# Patient Record
Sex: Female | Born: 1995 | Race: White | Hispanic: No | Marital: Single | Smoking: Never smoker
Health system: Southern US, Community
[De-identification: ages and names within clinical notes are randomized; demographics above are authoritative.]

## PROBLEM LIST (undated history)

## (undated) DIAGNOSIS — F319 Bipolar disorder, unspecified: Secondary | ICD-10-CM

## (undated) HISTORY — PX: TONSILLECTOMY: SUR1361

---

## 2017-03-24 ENCOUNTER — Encounter (HOSPITAL_BASED_OUTPATIENT_CLINIC_OR_DEPARTMENT_OTHER): Payer: Self-pay | Admitting: Emergency Medicine

## 2017-03-24 ENCOUNTER — Emergency Department (HOSPITAL_BASED_OUTPATIENT_CLINIC_OR_DEPARTMENT_OTHER)
Admission: EM | Admit: 2017-03-24 | Discharge: 2017-03-24 | Disposition: A | Discharge status: Home / Self Care | Payer: No Typology Code available for payment source | Attending: Emergency Medicine | Admitting: Emergency Medicine

## 2017-03-24 ENCOUNTER — Emergency Department (HOSPITAL_BASED_OUTPATIENT_CLINIC_OR_DEPARTMENT_OTHER): Payer: No Typology Code available for payment source

## 2017-03-24 ENCOUNTER — Other Ambulatory Visit: Payer: Self-pay

## 2017-03-24 DIAGNOSIS — S52614A Nondisplaced fracture of right ulna styloid process, initial encounter for closed fracture: Secondary | ICD-10-CM | POA: Insufficient documentation

## 2017-03-24 DIAGNOSIS — Y92410 Unspecified street and highway as the place of occurrence of the external cause: Secondary | ICD-10-CM | POA: Insufficient documentation

## 2017-03-24 DIAGNOSIS — M25529 Pain in unspecified elbow: Secondary | ICD-10-CM

## 2017-03-24 DIAGNOSIS — Y9389 Activity, other specified: Secondary | ICD-10-CM | POA: Diagnosis not present

## 2017-03-24 DIAGNOSIS — Y998 Other external cause status: Secondary | ICD-10-CM | POA: Insufficient documentation

## 2017-03-24 DIAGNOSIS — S6991XA Unspecified injury of right wrist, hand and finger(s), initial encounter: Secondary | ICD-10-CM | POA: Diagnosis present

## 2017-03-24 DIAGNOSIS — F319 Bipolar disorder, unspecified: Secondary | ICD-10-CM | POA: Insufficient documentation

## 2017-03-24 DIAGNOSIS — S62101A Fracture of unspecified carpal bone, right wrist, initial encounter for closed fracture: Secondary | ICD-10-CM

## 2017-03-24 HISTORY — DX: Bipolar disorder, unspecified: F31.9

## 2017-03-24 MED ORDER — OXYCODONE-ACETAMINOPHEN 5-325 MG PO TABS: 1.0000 | ORAL_TABLET | ORAL | 0 refills | Status: AC | PRN

## 2017-03-24 MED ORDER — OXYCODONE-ACETAMINOPHEN 5-325 MG PO TABS
1.0000 | ORAL_TABLET | Freq: Once | ORAL | Status: AC
  Administered 2017-03-24: 1 via ORAL
  Filled 2017-03-24: qty 1

## 2017-03-24 MED ORDER — IBUPROFEN 800 MG PO TABS: 800.0000 mg | ORAL_TABLET | Freq: Three times a day (TID) | ORAL | 0 refills | Status: DC | PRN

## 2017-03-24 MED FILL — OXYCODONE-ACETAMINOPHEN 5-3: 5-325 | 2 days supply | Qty: 12 | Fill #0

## 2017-03-24 MED FILL — IBUPROFEN 800 MG TAB: 800 | 7 days supply | Qty: 21 | Fill #0

## 2017-03-24 NOTE — Discharge Instructions (Signed)
You were seen today with a wrist fracture. This will heal well with splinting. Follow up with your PCP and the hand surgeon listed below. Call today to schedule a follow up appointment.   Return to the ED with any new or worsening pain.
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
## 2017-03-24 NOTE — ED Notes (Signed)
Pt teaching provided on medications that may cause drowsiness. Pt instructed not to drive or operate heavy machinery while taking the prescribed medication. Pt verbalized understanding.   

## 2017-04-06 ENCOUNTER — Other Ambulatory Visit: Payer: Self-pay | Admitting: Orthopedic Surgery

## 2017-04-07 ENCOUNTER — Other Ambulatory Visit: Payer: Self-pay | Admitting: Orthopedic Surgery

## 2017-04-07 DIAGNOSIS — S52614A Nondisplaced fracture of right ulna styloid process, initial encounter for closed fracture: Secondary | ICD-10-CM

## 2017-04-21 ENCOUNTER — Ambulatory Visit
Admission: RE | Admit: 2017-04-21 | Discharge: 2017-04-21 | Disposition: A | Discharge status: Home / Self Care | Payer: 59 | Source: Ambulatory Visit | Attending: Orthopedic Surgery | Admitting: Orthopedic Surgery

## 2017-04-21 ENCOUNTER — Other Ambulatory Visit: Payer: Self-pay

## 2017-04-21 DIAGNOSIS — S52614A Nondisplaced fracture of right ulna styloid process, initial encounter for closed fracture: Secondary | ICD-10-CM

## 2017-04-21 MED ORDER — IOPAMIDOL (ISOVUE-M 200) INJECTION 41%
5.0000 mL | Freq: Once | INTRAMUSCULAR | Status: AC
  Administered 2017-04-21: 5 mL via INTRA_ARTICULAR

## 2019-12-25 ENCOUNTER — Emergency Department (HOSPITAL_COMMUNITY)
Admission: EM | Admit: 2019-12-25 | Discharge: 2019-12-26 | Disposition: A | Discharge status: Home / Self Care | Payer: BLUE CROSS/BLUE SHIELD | Attending: Emergency Medicine | Admitting: Emergency Medicine

## 2019-12-25 ENCOUNTER — Other Ambulatory Visit: Payer: Self-pay

## 2019-12-25 DIAGNOSIS — R1031 Right lower quadrant pain: Secondary | ICD-10-CM | POA: Diagnosis present

## 2019-12-25 DIAGNOSIS — Z79899 Other long term (current) drug therapy: Secondary | ICD-10-CM | POA: Insufficient documentation

## 2019-12-25 DIAGNOSIS — N8301 Follicular cyst of right ovary: Secondary | ICD-10-CM | POA: Insufficient documentation

## 2019-12-25 DIAGNOSIS — R102 Pelvic and perineal pain: Secondary | ICD-10-CM

## 2019-12-25 DIAGNOSIS — N83201 Unspecified ovarian cyst, right side: Secondary | ICD-10-CM

## 2019-12-25 LAB — COMPREHENSIVE METABOLIC PANEL
ALT: 11 U/L (ref 0–44)
AST: 11 U/L — ABNORMAL LOW (ref 15–41)
Albumin: 4.2 g/dL (ref 3.5–5.0)
Alkaline Phosphatase: 43 U/L (ref 38–126)
Anion gap: 8 (ref 5–15)
BUN: 7 mg/dL (ref 6–20)
CO2: 27 mmol/L (ref 22–32)
Calcium: 9.5 mg/dL (ref 8.9–10.3)
Chloride: 107 mmol/L (ref 98–111)
Creatinine, Ser: 0.82 mg/dL (ref 0.44–1.00)
GFR calc Af Amer: >60 mL/min (ref >60)
GFR calc non Af Amer: >60 mL/min (ref >60)
Glucose, Bld: 94 mg/dL (ref 70–99)
Potassium: 3.9 mmol/L (ref 3.5–5.1)
Sodium: 142 mmol/L (ref 135–145)
Total Bilirubin: 0.5 mg/dL (ref 0.3–1.2)
Total Protein: 6.4 g/dL — ABNORMAL LOW (ref 6.5–8.1)

## 2019-12-25 LAB — CBC
HCT: 38.9 % (ref 36.0–46.0)
Hemoglobin: 13.3 g/dL (ref 12.0–15.0)
MCH: 31.7 pg (ref 26.0–34.0)
MCHC: 34.2 g/dL (ref 30.0–36.0)
MCV: 92.8 fL (ref 80.0–100.0)
Platelets: 184 10*3/uL (ref 150–400)
RBC: 4.19 MIL/uL (ref 3.87–5.11)
RDW: 11.9 % (ref 11.5–15.5)
WBC: 5.2 10*3/uL (ref 4.0–10.5)
nRBC: 0 % (ref 0.0–0.2)

## 2019-12-25 LAB — URINALYSIS, ROUTINE W REFLEX MICROSCOPIC
Bilirubin Urine: NEGATIVE
Glucose, UA: NEGATIVE mg/dL
Hgb urine dipstick: NEGATIVE
Ketones, ur: NEGATIVE mg/dL
Leukocytes,Ua: NEGATIVE
Nitrite: NEGATIVE
Protein, ur: NEGATIVE mg/dL
Specific Gravity, Urine: 1.002 — ABNORMAL LOW (ref 1.005–1.030)
pH: 7 (ref 5.0–8.0)

## 2019-12-25 LAB — LIPASE, BLOOD: Lipase: 29 U/L (ref 11–51)

## 2019-12-25 LAB — I-STAT BETA HCG BLOOD, ED (MC, WL, AP ONLY): I-stat hCG, quantitative: <5 m[IU]/mL (ref <5)

## 2019-12-25 NOTE — ED Triage Notes (Signed)
Patient reports abdominal pain, N/V X1 week

## 2019-12-26 ENCOUNTER — Emergency Department (HOSPITAL_COMMUNITY): Payer: BLUE CROSS/BLUE SHIELD

## 2019-12-26 MED ORDER — MORPHINE SULFATE (PF) 4 MG/ML IV SOLN
4.0000 mg | Freq: Once | INTRAVENOUS | Status: AC
  Administered 2019-12-26: 4 mg via INTRAVENOUS
  Filled 2019-12-26: qty 1

## 2019-12-26 MED ORDER — IBUPROFEN 800 MG PO TABS: 800.0000 mg | ORAL_TABLET | Freq: Three times a day (TID) | ORAL | 0 refills | Status: AC

## 2019-12-26 MED ORDER — IOHEXOL 300 MG/ML  SOLN
100.0000 mL | Freq: Once | INTRAMUSCULAR | Status: AC | PRN
  Administered 2019-12-26: 100 mL via INTRAVENOUS

## 2019-12-26 MED ORDER — ONDANSETRON HCL 4 MG/2ML IJ SOLN
4.0000 mg | Freq: Once | INTRAMUSCULAR | Status: AC
  Administered 2019-12-26: 4 mg via INTRAVENOUS
  Filled 2019-12-26: qty 2

## 2019-12-26 NOTE — ED Notes (Signed)
Patient transported to Ultrasound 

## 2019-12-26 NOTE — ED Notes (Signed)
Returned from CT.

## 2019-12-26 NOTE — ED Notes (Signed)
Verbalized understanding of DC instructions, Rx, follow up care 

## 2019-12-26 NOTE — ED Notes (Signed)
Returned from U/S

## 2019-12-26 NOTE — ED Provider Notes (Signed)
Attestation: Medical screening examination/treatment/procedure(s) were conducted as a shared visit with non-physician practitioner(s) and myself.  I personally evaluated the patient during the encounter.   Briefly, the patient is a 24 y.o. female here for 1 week of abd pain. Started periumbilically and moved to RLQ.   Vitals:   12/25/19 2211 12/26/19 0114  BP: 121/79 128/84  Pulse: 73 81  Resp: 16 16  Temp: 98.8 F (37.1 C) 99.1 F (37.3 C)  SpO2: 100% 100%    CONSTITUTIONAL:  well-appearing, NAD NEURO:  Alert and oriented x 3, no focal deficits EYES:  pupils equal and reactive ENT/NECK:  trachea midline, no JVD CARDIO:  reg rate, reg rhythm, well-perfused PULM:  None labored breathing GI/GU:  Abdomen non-distended MSK/SPINE:  No gross deformities, no edema SKIN:  no rash, atraumatic PSYCH:  Appropriate speech and behavior   EKG Interpretation  Date/Time:    Ventricular Rate:    PR Interval:    QRS Duration:   QT Interval:    QTC Calculation:   R Axis:     Text Interpretation:         Work up negative for appendicitis or torsion  The patient appears reasonably screened and/or stabilized for discharge and I doubt any other medical condition or other EMC requiring further screening, evaluation, or treatment in the ED at this time prior to discharge. Safe for discharge with strict return precautions.      Nira Conn, MD 12/26/19 [phone]

## 2019-12-26 NOTE — ED Provider Notes (Signed)
[facility] EMERGENCY DEPARTMENT Provider Note   CSN: [id number] Arrival date & time: 12/25/19  1801     History Chief Complaint  Patient presents with  . Abdominal Pain    Margaret Vaughan is a 24 y.o. female.  Patient presents to the emergency department with a chief complaint of right lower abdominal pain.  She states that she has been having the symptoms for about 1 week.  She states that the symptoms originally started around her bellybutton and have now migrated to the right lower abdomen.  She reports associated nausea and vomiting.  She states that she had some diarrhea earlier in the week.  She denies any fevers chills.  She states that she talked with her dad, who is an anesthesiologist about her symptoms, and advised her to come to the emergency department to be evaluated for appendicitis.  She denies any vaginal discharge or bleeding.  Denies any dysuria or hematuria.  Denies any other associated symptoms.  She states that the pain is quite severe.  The history is provided by the patient. No language interpreter was used.       Past Medical History:  Diagnosis Date  . Bipolar 1 disorder (HCC)     There are no problems to display for this patient.   No past surgical history on file.   OB History   No obstetric history on file.     No family history on file.  Social History   Tobacco Use  . Smoking status: Never Smoker  . Smokeless tobacco: Never Used  Substance Use Topics  . Alcohol use: No  . Drug use: No    Home Medications Prior to Admission medications   Medication Sig Start Date End Date Taking? Authorizing Provider  ibuprofen (ADVIL,MOTRIN) 800 MG tablet Take 1 tablet (800 mg total) every 8 (eight) hours as needed by mouth. 03/24/17   Long, Arlyss Repress, MD  lamoTRIgine (LAMICTAL) 150 MG tablet Take 150 mg daily by mouth.    [provider]  oxyCODONE-acetaminophen (PERCOCET/ROXICET) 5-325 MG tablet Take 1 tablet every 4  (four) hours as needed by mouth for severe pain. 03/24/17   Long, Arlyss Repress, MD    Allergies    Patient has no known allergies.  Review of Systems   Review of Systems  All other systems reviewed and are negative.   Physical Exam Updated Vital Signs BP 121/79 (BP Location: Left Arm)   Pulse 73   Temp 98.8 F (37.1 C) (Oral)   Resp 16   Ht 5' 3.5" (1.613 m)   Wt 56.7 kg   SpO2 100%   BMI 21.80 kg/m   Physical Exam Vitals and nursing note reviewed.  Constitutional:      General: She is not in acute distress.    Appearance: She is well-developed.  HENT:     Head: Normocephalic and atraumatic.  Eyes:     Conjunctiva/sclera: Conjunctivae normal.  Cardiovascular:     Rate and Rhythm: Normal rate and regular rhythm.     Heart sounds: No murmur heard.   Pulmonary:     Effort: Pulmonary effort is normal. No respiratory distress.     Breath sounds: Normal breath sounds.  Abdominal:     Palpations: Abdomen is soft.     Tenderness: There is abdominal tenderness.  Musculoskeletal:        General: Normal range of motion.     Cervical back: Neck supple.  Skin:    General: Skin is  warm and dry.  Neurological:     Mental Status: She is alert and oriented to person, place, and time.  Psychiatric:        Mood and Affect: Mood normal.        Behavior: Behavior normal.     ED Results / Procedures / Treatments   Labs (all labs ordered are listed, but only abnormal results are displayed) Labs Reviewed  COMPREHENSIVE METABOLIC PANEL - Abnormal; Notable for the following components:      Result Value   Total Protein 6.4 (*)    AST 11 (*)    All other components within normal limits  URINALYSIS, ROUTINE W REFLEX MICROSCOPIC - Abnormal; Notable for the following components:   Color, Urine COLORLESS (*)    Specific Gravity, Urine 1.002 (*)    All other components within normal limits  LIPASE, BLOOD  CBC  I-STAT BETA HCG BLOOD, ED ([facility], [facility], [facility] ONLY)     EKG None  Radiology CT ABDOMEN PELVIS W CONTRAST  Result Date: 12/26/2019 CLINICAL DATA:  Right lower quadrant abdominal pain. Appendicitis suspected. EXAM: CT ABDOMEN AND PELVIS WITH CONTRAST TECHNIQUE: Multidetector CT imaging of the abdomen and pelvis was performed using the standard protocol following bolus administration of intravenous contrast. CONTRAST:  OMNIPAQUE IOHEXOL 300 MG/ML  SOLN COMPARISON:  None. FINDINGS: Lower chest: The lung bases are clear. Hepatobiliary: No focal liver abnormality is seen. No gallstones, gallbladder wall thickening, or biliary dilatation. Pancreas: No ductal dilatation or inflammation. Spleen: Normal in size without focal abnormality. Adrenals/Urinary Tract: Normal adrenal glands. No hydronephrosis or perinephric edema. Homogeneous renal enhancement. Urinary bladder is completely empty and not well assessed. Stomach/Bowel: Bowel evaluation is limited in the absence of enteric contrast and paucity of intra-abdominal fat. The appendix is not definitively visualized, normal or abnormal. There is small amount of free fluid in the right pelvis but no discrete pericecal inflammation. Stomach and duodenum are normal. No small bowel obstruction or wall thickening. Moderate volume of stool throughout the colon. Vascular/Lymphatic: Abdominal aorta is normal in caliber. The portal vein is patent. No adenopathy. Reproductive: IUD appropriately position in the uterus. There is a 2.5 cm cyst in the right ovary. Left ovary tentatively visualized and normal. No suspicious adnexal mass. Other: Small amount of free fluid in the pelvis and right adnexa. No abscess or free air. Musculoskeletal: Hemi transitional lumbosacral anatomy. There are no acute or suspicious osseous abnormalities. IMPRESSION: 1. The appendix is not definitively visualized, normal or abnormal. There is small amount of free fluid in the right pelvis but no discrete pericecal inflammation. 2. A 2.5 cm cyst in  the right ovary is likely physiologic. Given right-sided pain, consider further evaluation with pelvic ultrasound. 3. Moderate colonic stool burden, can be seen with constipation. Electronically Signed   By: Narda Rutherford M.D.   On: 12/26/2019 01:25   US PELVIC COMPLETE W TRANSVAGINAL AND TORSION R/O  Result Date: 12/26/2019 CLINICAL DATA:  Right lower quadrant pain EXAM: TRANSABDOMINAL AND TRANSVAGINAL ULTRASOUND OF PELVIS DOPPLER ULTRASOUND OF OVARIES TECHNIQUE: Both transabdominal and transvaginal ultrasound examinations of the pelvis were performed. Transabdominal technique was performed for global imaging of the pelvis including uterus, ovaries, adnexal regions, and pelvic cul-de-sac. It was necessary to proceed with endovaginal exam following the transabdominal exam to visualize the uterus, endometrium, and ovaries. Color and duplex Doppler ultrasound was utilized to evaluate blood flow to the ovaries. COMPARISON:  Same-day CT abdomen and pelvis. FINDINGS: Uterus Measurements: 7.8 x 3.6 x 4.8  cm = volume: 71 mL. No fibroids or other mass visualized. Endometrium Thickness: 6 mm.  Echogenic, shadowing IUD in expected positioning. Right ovary Measurements: 4.1 x 2.7 x 2.7 cm = volume: 15.3 mL. Dominant follicle measuring 2.3 x 2.4 x 2.4 cm. No internal complexity or other worrisome features. No concerning right adnexal lesions. Left ovary Measurements: 3.0 x 1.3 x 2.5 cm = volume: 5.1 mL. Normal appearance/no adnexal mass. Pulsed Doppler evaluation of both ovaries demonstrates normal low-resistance arterial and venous waveforms. Other findings Small volume of fluid in the posterior cul-de-sac, possibly physiologic. Additional scans of the right lower quadrant in the indicated area pain and discomfort fail to demonstrate a discernible appendix. IMPRESSION: 1. Dominant follicle in the right ovary without concerning features. This has benign characteristics and is a common finding in premenopausal females. No  imaging follow up is required. This follows consensus guidelines: Simple Adnexal Cysts: SRU Consensus Conference Update on Follow-up and Reporting. 2. No evidence of ovarian torsion or other acute/emergent pelvic abnormality. 3. IUD in expected positioning. 4. Small volume of fluid in the pelvis is anechoic and may be within physiologic normal for a reproductive age female. 5. Sonographic assessment of the right lower quadrant without discernible appendix. Nonvisualization of the appendix is not sufficient to exclude an acute appendicitis. Electronically Signed   By: Kreg Shropshire M.D.   On: 12/26/2019 03:36    Procedures Procedures (including critical care time)  Medications Ordered in ED Medications  ondansetron (ZOFRAN) injection 4 mg (has no administration in time range)  morphine 4 MG/ML injection 4 mg (has no administration in time range)    ED Course  I have reviewed the triage vital signs and the nursing notes.  Pertinent labs & imaging results that were available during my care of the patient were reviewed by me and considered in my medical decision making (see chart for details).    MDM Rules/Calculators/A&P                          This patient complains of right lower abdominal pain, this involves an extensive number of treatment options, and is a complaint that carries with it a high risk of complications and morbidity.  The differential diagnosis includes appendicitis, UTI, ovarian cyst, torsion, kidney stone.  Pertinent Labs I ordered, reviewed, and interpreted labs, which included CBC, CMP, lipase, hCG, and UA, all of which are reassuring.  Imaging Interpretation I ordered imaging studies which included CT abdomen/pelvis, which did not definitively visualize the appendix, but there is no discrete pericecal inflammation.  There is noted to be a 2.5 cm cyst in the right ovary.  Ultrasound was ordered to further evaluate the cyst as well as to rule out torsion, although torsion  thought to be less likely.  Ultrasound shows dominant follicle in the right ovary without concerning features.  There is no evidence of ovarian torsion.   Medications I ordered medication morphine and Zofran for pain and nausea.  Sources Previous records obtained and reviewed and no recent prior visits were found.  Reassessments After the interventions stated above, I reevaluated the patient and found in no acute distress.  Patient seen by discussed with Dr. Eudelia Bunch, who agrees with plan for discharge, no further work-up, consultation, or admission indicated tonight.  I did discuss return precautions.  Discussed in depth that appendicitis is thought to be less likely given the length of time she has been having pain (1 week), normal labs,  and no findings suggestive of appendicitis on CT or ultrasound.  We did discuss that abdominal pain can evolve, and that if she worsens she is to return to the emergency department.  I believe that her symptoms are likely coming from the ovarian cyst and have recommended NSAIDs.  Patient is agreeable with this plan.  Final Clinical Impression(s) / ED Diagnoses Final diagnoses:  Right lower quadrant abdominal pain  Cyst of right ovary    Rx / DC Orders ED Discharge Orders    None       Roxy HorsemanBrowning, Danna Sewell, PA-C 12/26/19 0426    Nira Connardama, Pedro Eduardo, MD 12/26/19 [phone]

## 2019-12-26 NOTE — Discharge Instructions (Addendum)
The radiologist was unable to identify your appendix on your CT scan.  You do have a 2.5 cm right sided ovarian cyst that would explain the pain you are feeling.  This was better visualized on the ultrasound and did not have any concerning features.  No evidence of ovarian torsion was seen on the ultrasound.  Your labs and vitals and the length of time that you have been having symptoms would suggest that appendicitis is less likely, but if you have worsening pain, fever, or any other symptoms that you find concerning, you should return for repeat assessment.

## 2019-12-26 NOTE — ED Notes (Signed)
Patient transported to CT 

## 2020-01-25 ENCOUNTER — Emergency Department (HOSPITAL_BASED_OUTPATIENT_CLINIC_OR_DEPARTMENT_OTHER): Payer: BLUE CROSS/BLUE SHIELD

## 2020-01-25 ENCOUNTER — Emergency Department (HOSPITAL_BASED_OUTPATIENT_CLINIC_OR_DEPARTMENT_OTHER)
Admission: EM | Admit: 2020-01-25 | Discharge: 2020-01-26 | Disposition: A | Discharge status: Home / Self Care | Payer: BLUE CROSS/BLUE SHIELD | Attending: Emergency Medicine | Admitting: Emergency Medicine

## 2020-01-25 ENCOUNTER — Encounter (HOSPITAL_BASED_OUTPATIENT_CLINIC_OR_DEPARTMENT_OTHER): Payer: Self-pay

## 2020-01-25 ENCOUNTER — Other Ambulatory Visit: Payer: Self-pay

## 2020-01-25 DIAGNOSIS — R1031 Right lower quadrant pain: Secondary | ICD-10-CM | POA: Diagnosis not present

## 2020-01-25 DIAGNOSIS — R1030 Lower abdominal pain, unspecified: Secondary | ICD-10-CM

## 2020-01-25 DIAGNOSIS — R110 Nausea: Secondary | ICD-10-CM | POA: Insufficient documentation

## 2020-01-25 LAB — URINALYSIS, ROUTINE W REFLEX MICROSCOPIC
Bilirubin Urine: NEGATIVE
Glucose, UA: NEGATIVE mg/dL
Hgb urine dipstick: NEGATIVE
Ketones, ur: NEGATIVE mg/dL
Leukocytes,Ua: NEGATIVE
Nitrite: NEGATIVE
Protein, ur: NEGATIVE mg/dL
Specific Gravity, Urine: 1.01 (ref 1.005–1.030)
pH: 6.5 (ref 5.0–8.0)

## 2020-01-25 LAB — COMPREHENSIVE METABOLIC PANEL
ALT: 10 U/L (ref 0–44)
AST: 13 U/L — ABNORMAL LOW (ref 15–41)
Albumin: 4.6 g/dL (ref 3.5–5.0)
Alkaline Phosphatase: 41 U/L (ref 38–126)
Anion gap: 10 (ref 5–15)
BUN: 11 mg/dL (ref 6–20)
CO2: 24 mmol/L (ref 22–32)
Calcium: 9.1 mg/dL (ref 8.9–10.3)
Chloride: 105 mmol/L (ref 98–111)
Creatinine, Ser: 0.76 mg/dL (ref 0.44–1.00)
GFR calc Af Amer: >60 mL/min (ref >60)
GFR calc non Af Amer: >60 mL/min (ref >60)
Glucose, Bld: 95 mg/dL (ref 70–99)
Potassium: 3.8 mmol/L (ref 3.5–5.1)
Sodium: 139 mmol/L (ref 135–145)
Total Bilirubin: 0.3 mg/dL (ref 0.3–1.2)
Total Protein: 7 g/dL (ref 6.5–8.1)

## 2020-01-25 LAB — CBC
HCT: 39.9 % (ref 36.0–46.0)
Hemoglobin: 13.4 g/dL (ref 12.0–15.0)
MCH: 31.4 pg (ref 26.0–34.0)
MCHC: 33.6 g/dL (ref 30.0–36.0)
MCV: 93.4 fL (ref 80.0–100.0)
Platelets: 238 10*3/uL (ref 150–400)
RBC: 4.27 MIL/uL (ref 3.87–5.11)
RDW: 12.3 % (ref 11.5–15.5)
WBC: 6 10*3/uL (ref 4.0–10.5)
nRBC: 0 % (ref 0.0–0.2)

## 2020-01-25 LAB — PREGNANCY, URINE: Preg Test, Ur: NEGATIVE

## 2020-01-25 LAB — WET PREP, GENITAL
Clue Cells Wet Prep HPF POC: NONE SEEN
Sperm: NONE SEEN
Trich, Wet Prep: NONE SEEN
Yeast Wet Prep HPF POC: NONE SEEN

## 2020-01-25 LAB — LIPASE, BLOOD: Lipase: 36 U/L (ref 11–51)

## 2020-01-25 MED ORDER — IOHEXOL 300 MG/ML  SOLN
100.0000 mL | Freq: Once | INTRAMUSCULAR | Status: AC | PRN
  Administered 2020-01-25: 100 mL via INTRAVENOUS

## 2020-01-25 MED ORDER — FENTANYL CITRATE (PF) 100 MCG/2ML IJ SOLN
50.0000 ug | Freq: Once | INTRAMUSCULAR | Status: AC
  Administered 2020-01-25: 50 ug via INTRAVENOUS
  Filled 2020-01-25: qty 2

## 2020-01-25 MED ORDER — ONDANSETRON HCL 4 MG/2ML IJ SOLN
4.0000 mg | Freq: Once | INTRAMUSCULAR | Status: AC
  Administered 2020-01-25: 4 mg via INTRAVENOUS
  Filled 2020-01-25: qty 2

## 2020-01-25 MED ORDER — HYDROCODONE-ACETAMINOPHEN 5-325 MG PO TABS: 1.0000 | ORAL_TABLET | Freq: Four times a day (QID) | ORAL | 0 refills | Status: AC | PRN

## 2020-01-25 MED ORDER — SODIUM CHLORIDE 0.9 % IV BOLUS
1000.0000 mL | Freq: Once | INTRAVENOUS | Status: AC
  Administered 2020-01-25: 1000 mL via INTRAVENOUS

## 2020-01-25 NOTE — ED Provider Notes (Signed)
[facility] Provider Note   CSN: [id number] Arrival date & time: 01/25/20  1607     History Chief Complaint  Patient presents with   Abdominal Pain    Margaret Vaughan is a 24 y.o. female past history of bipolar 1 disorder who presents for evaluation of abdominal pain.  Patient was initially seen in the ED on 12/25/2019 for evaluation of right lower quadrant abdominal pain.  She had a work-up and a CT scan.  She states that the CT scan showed a 2.5 cm right ovarian cyst.  They stated that they could not see her appendix but her work-up was not consistent with appendicitis.  She was discharged home with OB/GYN follow-up.  Patient reports that since then, she has continued to have persistent pain that she rates at a 5/10.  She saw her OB/GYN and was prescribed Protonix as there was some concern that there might be a GI cause to her symptoms.  Patient reports that over the last several weeks, her pain has persisted.  She states she felt like the pain was radiating up into her right upper quadrant and into her right shoulder.  She states she has had nausea but no vomiting.  She states that when she eats greasy or spicy food or coffee, she notices that makes the pain worse and makes her more nauseous.  She states that today, the pain became acutely worse.  She states that it has been consistently at 8/10.  She has been taking ibuprofen at home which made pain manageable but today when pain worsened, she came to the ED for further evaluation.  She has not noted any fever.  She denies any dysuria, hematuria, chest pain, cough, diarrhea.  She has noticed that her stools are paler in nature.  The history is provided by the patient.       Past Medical History:  Diagnosis Date   Bipolar 1 disorder ([place])     There are no problems to display for this patient.   Past Surgical History:  Procedure Laterality Date   TONSILLECTOMY       OB History   No obstetric history  on file.     No family history on file.  Social History   Tobacco Use   Smoking status: Never Smoker   Smokeless tobacco: Never Used  Substance Use Topics   Alcohol use: No   Drug use: No    Home Medications Prior to Admission medications   Medication Sig Start Date End Date Taking? Authorizing Provider  HYDROcodone-acetaminophen (NORCO/VICODIN) 5-325 MG tablet Take 1-2 tablets by mouth every 6 (six) hours as needed. 01/25/20   Volanda Napoleon, PA-C  ibuprofen (ADVIL) 800 MG tablet Take 1 tablet (800 mg total) by mouth 3 (three) times daily. 12/26/19   Montine Circle, PA-C  lamoTRIgine (LAMICTAL) 150 MG tablet Take 150 mg daily by mouth.    [provider]  oxyCODONE-acetaminophen (PERCOCET/ROXICET) 5-325 MG tablet Take 1 tablet every 4 (four) hours as needed by mouth for severe pain. 03/24/17   Long, Wonda Olds, MD    Allergies    Lavender oil  Review of Systems   Review of Systems  Constitutional: Negative for fever.  Respiratory: Negative for cough and shortness of breath.   Cardiovascular: Negative for chest pain.  Gastrointestinal: Positive for abdominal pain and nausea. Negative for diarrhea and vomiting.  Genitourinary: Negative for dysuria and hematuria.  Neurological: Negative for headaches.  All other systems reviewed and  are negative.   Physical Exam Updated Vital Signs BP 110/69 (BP Location: Right Arm)    Pulse 67    Temp 98.4 F (36.9 C) (Oral)    Resp 20    Ht _0  (1.6 m)    Wt 56.7 kg    LMP 01/11/2020    SpO2 100%    BMI 22.14 kg/m   Physical Exam Vitals and nursing note reviewed. Exam conducted with a chaperone present.  Constitutional:      Appearance: Normal appearance. She is well-developed.  HENT:     Head: Normocephalic and atraumatic.  Eyes:     General: Lids are normal.     Conjunctiva/sclera: Conjunctivae normal.     Pupils: Pupils are equal, round, and reactive to light.  Cardiovascular:     Rate and Rhythm: Normal rate  and regular rhythm.     Pulses: Normal pulses.     Heart sounds: Normal heart sounds. No murmur heard.  No friction rub. No gallop.   Pulmonary:     Effort: Pulmonary effort is normal.     Breath sounds: Normal breath sounds.     Comments: Lungs clear to auscultation bilaterally.  Symmetric chest rise.  No wheezing, rales, rhonchi. Abdominal:     Palpations: Abdomen is soft. Abdomen is not rigid.     Tenderness: There is abdominal tenderness in the right upper quadrant and right lower quadrant. There is right CVA tenderness. There is no guarding. Positive signs include Murphy's sign.     Comments: Abdomen is soft, nondistended.  Diffuse tenderness noted to right side of abdomen, particularly the right upper quadrant.  Positive Murphy sign.  No focal tenderness noted McBurney's point.  No rigidity, guarding.  Right-sided CVA tenderness noted.  No left-sided CVA tenderness.  Genitourinary:    Vagina: Normal.     Cervix: No cervical motion tenderness or discharge.     Uterus: Normal.      Adnexa:        Right: No mass or tenderness.         Left: No mass or tenderness.       Comments: The exam was performed with a chaperone present.  Normal external female genitalia. No lesions, rash, or sores. No CMT. No cervical discharge noted. No adnexal mass or tenderness bilaterally.  Musculoskeletal:        General: Normal range of motion.     Cervical back: Full passive range of motion without pain.  Skin:    General: Skin is warm and dry.     Capillary Refill: Capillary refill takes less than 2 seconds.  Neurological:     Mental Status: She is alert and oriented to person, place, and time.  Psychiatric:        Speech: Speech normal.     ED Results / Procedures / Treatments   Labs (all labs ordered are listed, but only abnormal results are displayed) Labs Reviewed  WET PREP, GENITAL - Abnormal; Notable for the following components:      Result Value   WBC, Wet Prep HPF POC MANY (*)    All  other components within normal limits  COMPREHENSIVE METABOLIC PANEL - Abnormal; Notable for the following components:   AST 13 (*)    All other components within normal limits  LIPASE, BLOOD  CBC  URINALYSIS, ROUTINE W REFLEX MICROSCOPIC  PREGNANCY, URINE  GC/CHLAMYDIA PROBE AMP ([place]) NOT AT [facility]    EKG None  Radiology CT ABDOMEN PELVIS W  CONTRAST  Result Date: 01/25/2020 CLINICAL DATA:  Abdominal pain nonlocalized, right lower quadrant pain radiates to back and right shoulder. EXAM: CT ABDOMEN AND PELVIS WITH CONTRAST TECHNIQUE: Multidetector CT imaging of the abdomen and pelvis was performed using the standard protocol following bolus administration of intravenous contrast. CONTRAST:  161m OMNIPAQUE IOHEXOL 300 MG/ML  SOLN COMPARISON:  CT abdomen pelvis 12/26/2019 FINDINGS: Lower chest: Stable pulmonary micronodule within the right lower lobe. Otherwise unremarkable. Hepatobiliary: No focal liver abnormality is seen. No gallstones, gallbladder wall thickening, or biliary dilatation. Pancreas: Unremarkable. No pancreatic ductal dilatation or surrounding inflammatory changes. Spleen: Normal in size without focal abnormality. Adrenals/Urinary Tract: No adrenal nodule bilaterally. Bilateral kidneys enhance symmetrically. No hydronephrosis. No hydroureter. The urinary bladder is unremarkable. Stomach/Bowel: Stomach is within normal limits. The base of the appendix is normal in caliber with no inflammatory changes. The tip of the appendix not well visualized. No evidence of bowel wall thickening, distention, or inflammatory changes. Increased stool within the rectum. Vascular/Lymphatic: No significant vascular findings are present. No enlarged abdominal or pelvic lymph nodes. Reproductive: A t shaped intrauterine device is noted in grossly appropriate position within the endometrial canal. The uterus is otherwise unremarkable. Interval increase in size of a 3.1 x 2.8 x 3.6 cm fluid density  lesion within the right ovary. Otherwise bilateral adnexa are unremarkable. Other: No intraperitoneal free fluid. No intraperitoneal free gas. No organized fluid collection. No right lower quadrant inflammatory changes. Musculoskeletal: No acute or significant osseous findings. IMPRESSION: 1. Increased stool burden within the rectum. Otherwise no acute intra-abdominal or intrapelvic abnormality. 2. T shaped intrauterine device in grossly appropriate position. 3. A 3.6 cm right ovarian cyst. Considering size and pre-menopausal age, no follow-up indicated. Electronically Signed   By: MIven FinnM.D.   On: 01/25/2020 21:12    Procedures Procedures (including critical care time)  Medications Ordered in ED Medications  ondansetron (ZOFRAN) injection 4 mg (4 mg Intravenous Given 01/25/20 2045)  fentaNYL (SUBLIMAZE) injection 50 mcg (50 mcg Intravenous Given 01/25/20 2046)  sodium chloride 0.9 % bolus 1,000 mL (0 mLs Intravenous Stopped 01/25/20 2203)  iohexol (OMNIPAQUE) 300 MG/ML solution 100 mL (100 mLs Intravenous Contrast Given 01/25/20 2051)    ED Course  I have reviewed the triage vital signs and the nursing notes.  Pertinent labs & imaging results that were available during my care of the patient were reviewed by me and considered in my medical decision making (see chart for details).    MDM Rules/Calculators/A&P                          24year old female who presents for evaluation of abdominal pain.  Seen in the ED on 12/25/2019.  She reports she has had persistent pain since then.  States it worsened today.  Associate with nausea.  No fevers.  No vomiting.  Reports her stools have been light in color.  On initially arrival, she is afebrile, nontoxic-appearing.  Vital signs are stable.  On exam, she does have tenderness into the right abdomen, particularly in right upper quadrant.  Positive Murphy sign.  No focal tenderness noted McBurney's point.  Concern for hepatobiliary etiology versus  infectious etiology.  Low suspicion for appendicitis.  Also consider pain from ovarian cyst.  Low suspicion for ovarian torsion.  Labs ordered at triage.  We will plan for CT abdomen pelvis for further evaluation of of her symptoms.  At this time, we do not have ultrasound  capability.  CMP shows normal BUN and creatinine.  AST is 13, ALT is 10, alk phos is 41, total bili is 0.3.  Lipase is unremarkable.  UA is negative for any infectious etiology.  Urine pregnancy is negative.  CBC shows no evidence of leukocyte ptosis or anemia.  CT scan shows no evidence of gallstones, gallbladder wall thickening or biliary dilatation.  Pancreas was unremarkable.  The base of the appendix is normal in caliber without any signs of inflammatory changes.  The tip of the appendix is not well-visualized but there is no secondary inflammatory changes. There is mention of a right ovarian cyst that is 3.6 cm which is increased from previous 2.5 cm.   Pelvic exam as document above.  No CMT.  No adnexal mass or tenderness noted bilaterally.  No discharge noted.  Pelvic exam not concerning for PID.  Reevaluation.  Patient is sitting comfortably in bed.  She reports improvement in pain.  She is hemodynamically stable.  At this time, her exam is not concerning for ovarian torsion.  I did discuss with her that her increased pain could be from ovarian cyst.  She is very concerned about her gallbladder.  I discussed with her that given reassuring LFTs and findings on CT scan, suspicion for hepatobiliary etiology is very low.  I have referred her to outpatient GI who she can follow-up with.  Additionally, I arrange for her to have an ultrasound tomorrow morning.  I discussed with her to call the facility to ensure that there is ultrasound here.  Will give short course of pain medication for acute breakthrough pain.  Patient instructed to return the emergency department for any worsening or concerning pain. At this time, patient exhibits no  emergent life-threatening condition that require further evaluation in ED or admission. Patient had ample opportunity for questions and discussion. All patient's questions were answered with full understanding. Strict return precautions discussed. Patient expresses understanding and agreement to plan.   Portions of this note were generated with [employer]. Dictation errors may occur despite best attempts at proofreading.  Final Clinical Impression(s) / ED Diagnoses Final diagnoses:  Lower abdominal pain    Rx / DC Orders ED Discharge Orders         Ordered    US PELVIC COMPLETE W TRANSVAGINAL AND TORSION R/O        01/25/20 2319    HYDROcodone-acetaminophen (NORCO/VICODIN) 5-325 MG tablet  Every 6 hours PRN        01/25/20 2337           Volanda Napoleon, PA-C 01/26/20 2030    Truddie Hidden, MD 01/28/20 [phone]

## 2020-01-25 NOTE — Discharge Instructions (Addendum)
As we discussed, your labs and CT scan were reassuring.  Your CT scan did not show any signs of gallstones or evidence of gallbladder wall thickening that would be concerning for secondary signs.  As we discussed, there could still be small gallstones are not picked up on the CT scan.  I provided you an outpatient referral to GI but they can follow-up with.  They can arrange for an outpatient right upper quadrant ultrasound for evaluation.  You can take Tylenol or Ibuprofen as directed for pain. You can alternate Tylenol and Ibuprofen every 4 hours. If you take Tylenol at 1pm, then you can take Ibuprofen at 5pm. Then you can take Tylenol again at 9pm.   Take pain medications as directed for break through pain. Do not drive or operate machinery while taking this medication.   As we discussed, your ovarian cyst has gotten slightly larger.  It is now 3 cm.  This could be contributing to your pain.  Follow-up with your OB/GYN.  I have also provided you a prescription for an ultrasound for tomorrow.  Drink back to the main area and get the ultrasound done for evaluation of the ovarian cyst.  Return the emergency department for any worsening pain, vomiting, fevers or any other worsening concerning symptoms.

## 2020-01-25 NOTE — ED Notes (Signed)
Pt. Started with pain originally 5 wks ago and was seen at [facility].  Pt. Has had intense work up and reports she has had R upper quadrant pain with reports of pain 9/10 today and feels the pain in upper R abd. And R rib area and upper R back area.  Pt. Reports pain after eating and nausea starting after eating now.

## 2020-01-25 NOTE — ED Triage Notes (Signed)
Pt arrives with RLQ pain that radiates into her back and into her right shoulder, had been worked up for similar pain recently and found to have a cyst. Pt was placed on Protonix reports her pain is not getting better.

## 2020-01-28 LAB — GC/CHLAMYDIA PROBE AMP (~~LOC~~) NOT AT ARMC
Chlamydia: NEGATIVE
Comment: NEGATIVE
Comment: NORMAL
Neisseria Gonorrhea: NEGATIVE

## 2021-08-26 IMAGING — CT CT ABD-PELV W/ CM
2 of 4 series · 16 of 46 positions shown, 18 images · IV contrast (omnipaque)
Comparison: None.

CLINICAL DATA: Right lower quadrant abdominal pain. Appendicitis
suspected.

EXAM:
CT ABDOMEN AND PELVIS WITH CONTRAST
TECHNIQUE: Multidetector CT imaging of the abdomen and pelvis was performed
using the standard protocol following bolus administration of
intravenous contrast.
CONTRAST:  100mL OMNIPAQUE IOHEXOL 300 MG/ML  SOLN

[Series 3: a/p w/ 5mm · axial · 0.64mm/px · z∈[+844,+1234]mm · 13 of 86 slices shown, 15 images]
[im 4/86  soft-tissue]
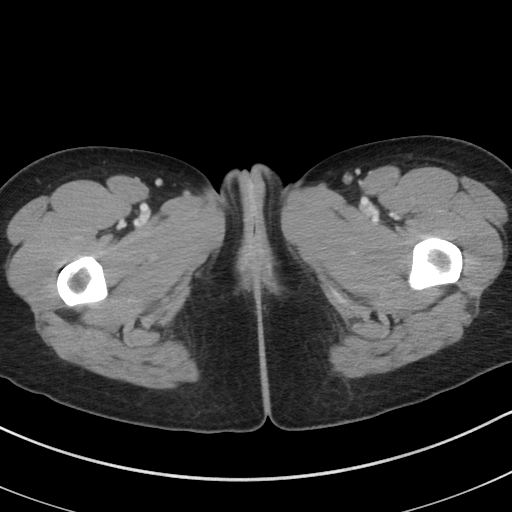
[im 4/86  bone]
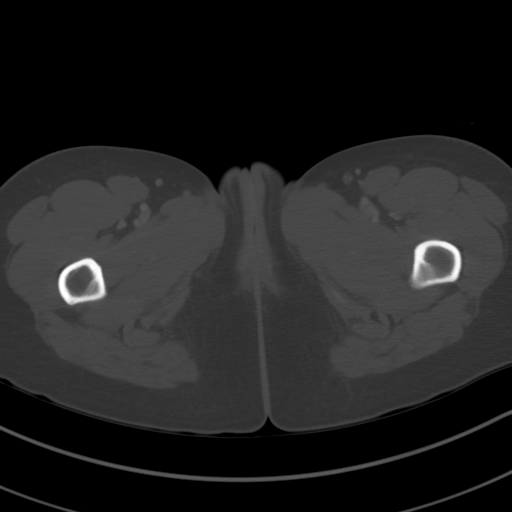
[im 11/86  soft-tissue]
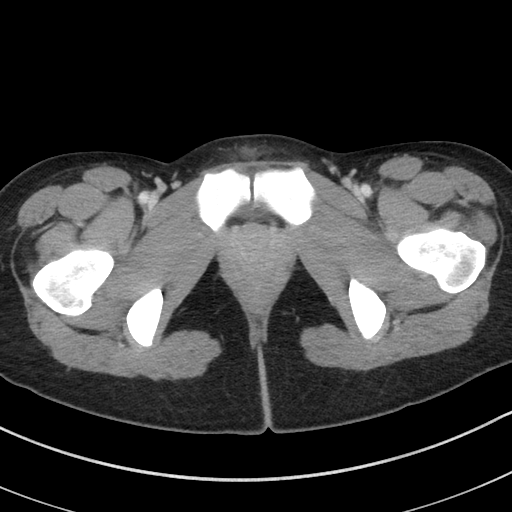
[im 18/86  soft-tissue]
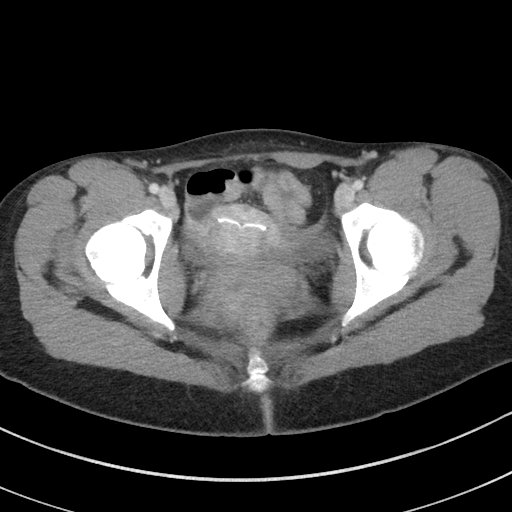
[im 25/86  soft-tissue]
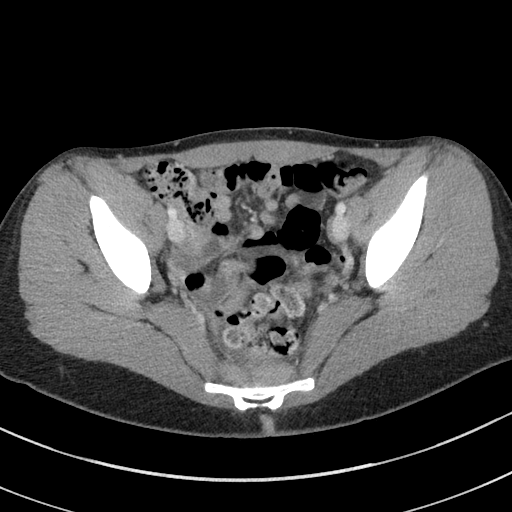
[im 29/86  soft-tissue]
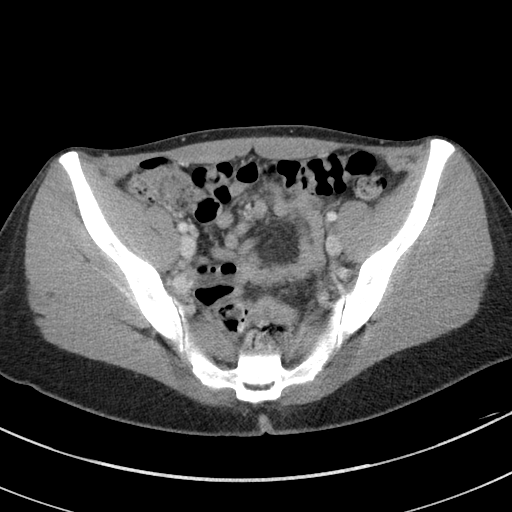
[im 36/86  soft-tissue]
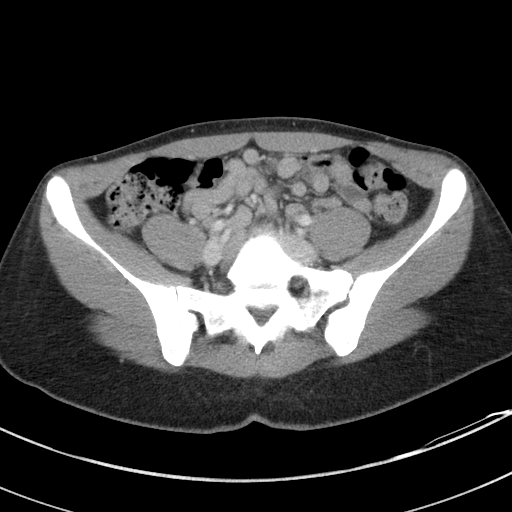
[im 43/86  soft-tissue]
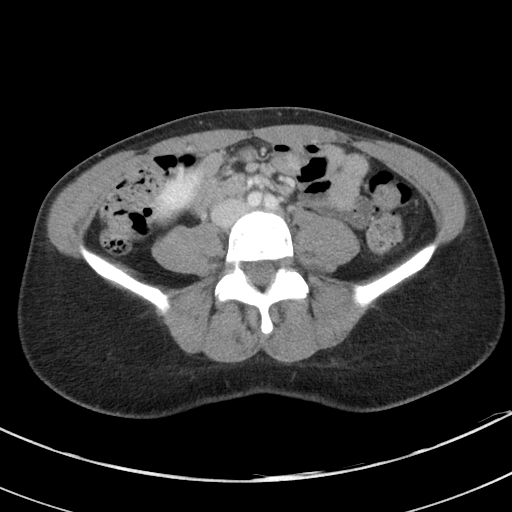
[im 50/86  soft-tissue]
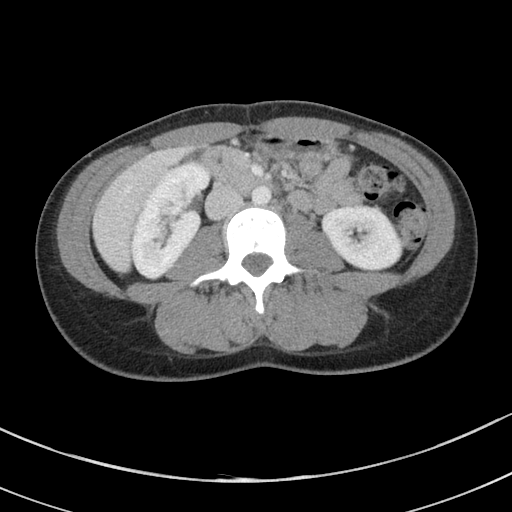
[im 57/86  soft-tissue]
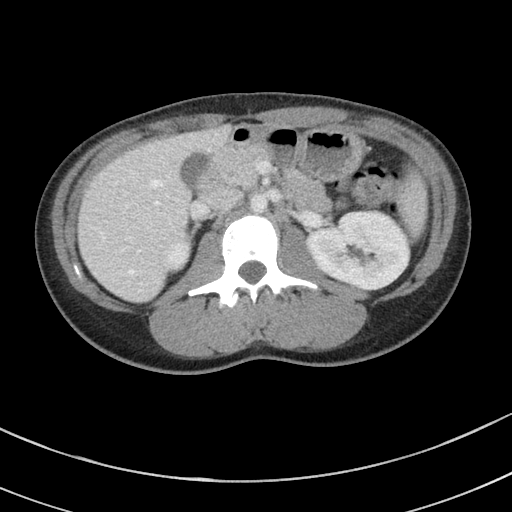
[im 57/86  bone]
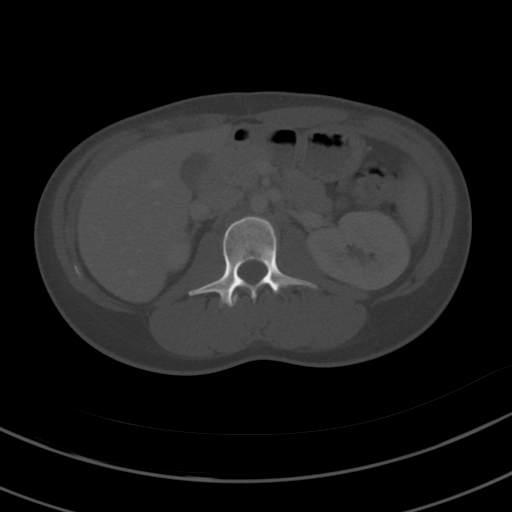
[im 61/86  soft-tissue]
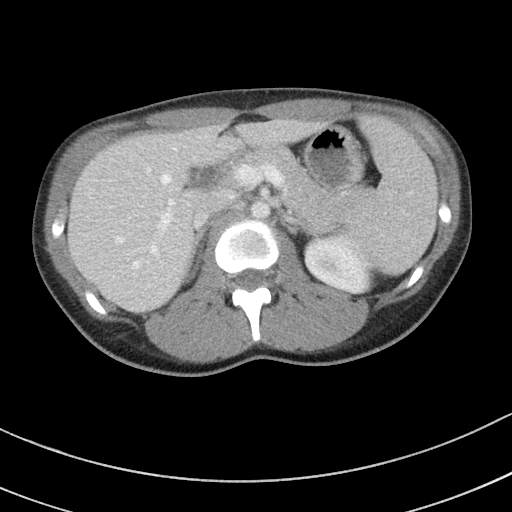
[im 68/86  soft-tissue]
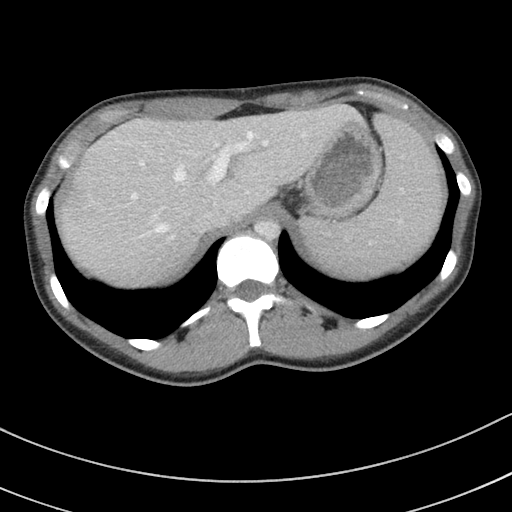
[im 75/86  soft-tissue]
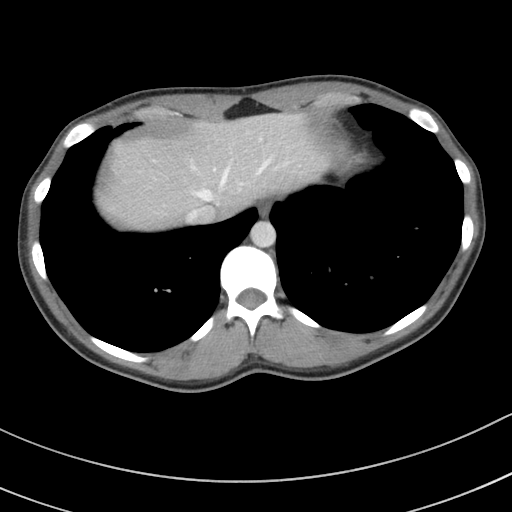
[im 82/86  soft-tissue]
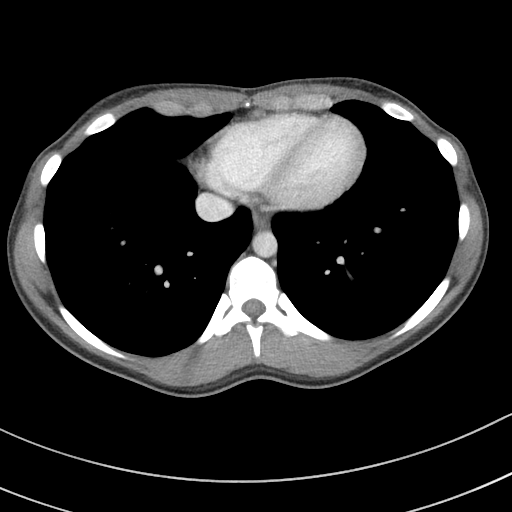

[Series 6: a/p w/ cor · coronal · 0.67mm/px · 3 of 110 slices shown]
[im 37/110  soft-tissue]
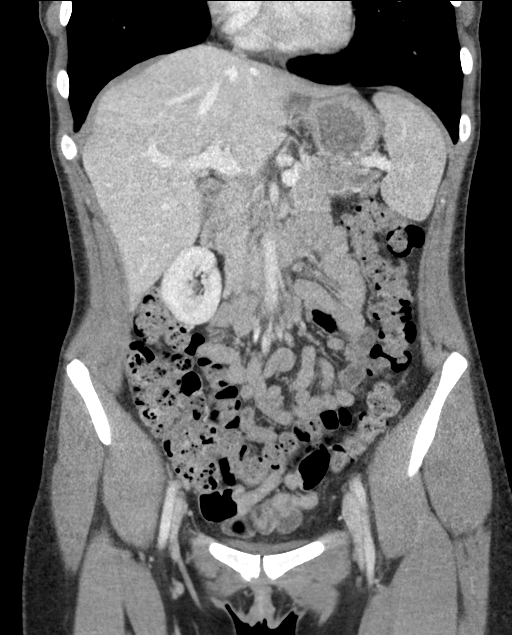
[im 49/110  soft-tissue]
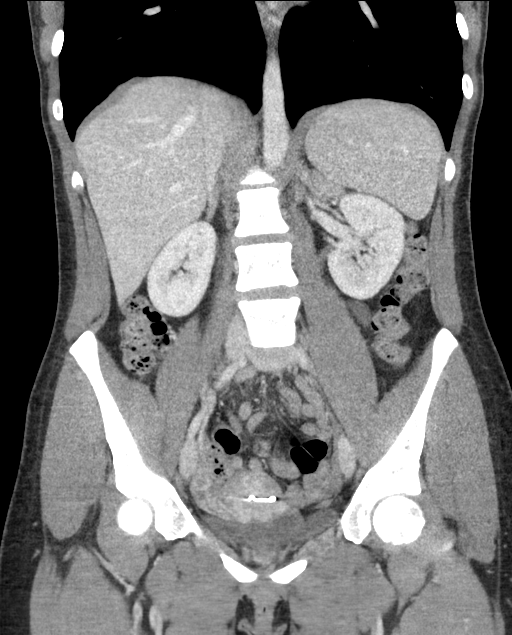
[im 61/110  soft-tissue]
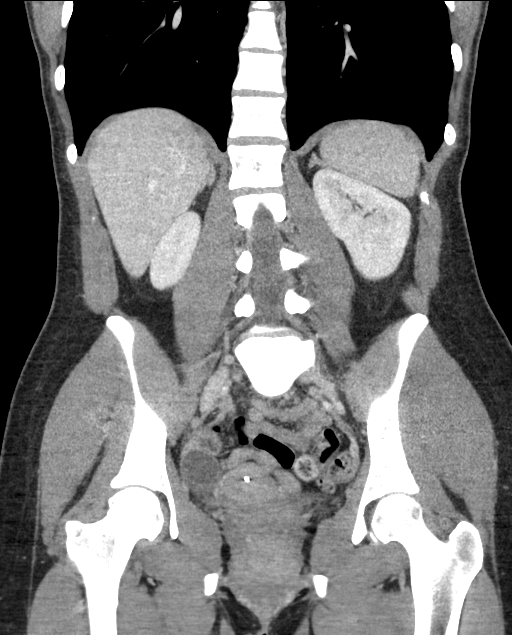

[16 of 46 positions shown; findings below may reference images not displayed]

FINDINGS: Lower chest: The lung bases are clear.

Hepatobiliary: No focal liver abnormality is seen. No gallstones,
gallbladder wall thickening, or biliary dilatation.

Pancreas: No ductal dilatation or inflammation.

Spleen: Normal in size without focal abnormality.

Adrenals/Urinary Tract: Normal adrenal glands. No hydronephrosis or
perinephric edema. Homogeneous renal enhancement. Urinary bladder is
completely empty and not well assessed.

Stomach/Bowel: Bowel evaluation is limited in the absence of enteric
contrast and paucity of intra-abdominal fat. The appendix is not
definitively visualized, normal or abnormal. There is small amount
of free fluid in the right pelvis but no discrete pericecal
inflammation. Stomach and duodenum are normal. No small bowel
obstruction or wall thickening. Moderate volume of stool throughout
the colon.

Vascular/Lymphatic: Abdominal aorta is normal in caliber. The portal
vein is patent. No adenopathy.

Reproductive: IUD appropriately position in the uterus. There is a
2.5 cm cyst in the right ovary. Left ovary tentatively visualized
and normal. No suspicious adnexal mass.

Other: Small amount of free fluid in the pelvis and right adnexa. No
abscess or free air.

Musculoskeletal: Hemi transitional lumbosacral anatomy. There are no
acute or suspicious osseous abnormalities.
IMPRESSION: 1. The appendix is not definitively visualized, normal or abnormal.
There is small amount of free fluid in the right pelvis but no
discrete pericecal inflammation.
2. A 2.5 cm cyst in the right ovary is likely physiologic. Given
right-sided pain, consider further evaluation with pelvic
ultrasound.
3. Moderate colonic stool burden, can be seen with constipation.
